# Patient Record
Sex: Female | Born: 1975 | Race: White | Hispanic: No | Marital: Married | Smoking: Never smoker
Health system: Southern US, Community
[De-identification: ages and names within clinical notes are randomized; demographics above are authoritative.]

## PROBLEM LIST (undated history)

## (undated) DIAGNOSIS — R002 Palpitations: Secondary | ICD-10-CM

## (undated) DIAGNOSIS — R102 Pelvic and perineal pain: Principal | ICD-10-CM

## (undated) DIAGNOSIS — D649 Anemia, unspecified: Secondary | ICD-10-CM

## (undated) DIAGNOSIS — G589 Mononeuropathy, unspecified: Secondary | ICD-10-CM

---
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
## 2011-10-07 HISTORY — PX: BREAST SURGERY: SHX581

## 2012-10-06 HISTORY — PX: ABLATION: SHX5711

## 2017-04-22 ENCOUNTER — Ambulatory Visit (INDEPENDENT_AMBULATORY_CARE_PROVIDER_SITE_OTHER): Payer: 59 | Admitting: Podiatry

## 2017-04-22 ENCOUNTER — Encounter: Payer: Self-pay | Admitting: Podiatry

## 2017-04-22 VITALS — BP 117/67 | HR 69 | Resp 18

## 2017-04-22 DIAGNOSIS — B070 Plantar wart: Secondary | ICD-10-CM | POA: Diagnosis not present

## 2017-04-22 NOTE — Patient Instructions (Signed)
Today your exam demonstrated a growth that is most likely a plantar wart. As you have a history of recent infection will defer curettage for removal until next week

## 2017-04-22 NOTE — Progress Notes (Signed)
   Subjective:    Patient ID: Shannon Acosta, female    DOB: 01-Aug-1976, 41 y.o.   MRN: [id number]  HPI This patient presents today stating that her previous podiatrist as referred her to our office for a second opinion. Patient describes a painful plantar skin lesion present since approximately March 2018. She visited podiatrist in [place] approximately June 22 and describes 1 treatment with cryotherapy. After the treatment patient describes an infection posttreatment and has completed 20 days of Augmentin with last dose one day prior. At this time the lesion still persists as uncomfortable walking wearing shoes. Patient ambulates and surgical shoe.  Patient is otherwise healthy with no known health concerns or active medications Patient denies smoking history Patient states she is recently relocated from [place] to [place]  All other systems reviewed and are negative.      Objective:   Physical Exam  BP of 117/67 Pulse 69 Respiration respiration 18   Vascular: No calf edema or calf tenderness bilaterally DP and PT pulses 2/4 bilaterally Capillary reflex immediate bilaterally  Neurological: Proprioception intact bilaterally  Dermatological: No open skin lesions bilaterally Texture and turgor within normal bilaterally Plantar hemorrhagic skin lesion approximately 5 mm plantar base fifth metatarsal area. Debridement of this lesion demonstrates pinpoint bleeding with some hyperkeratotic tissue. There is no surrounding erythema, edema, drainage, warmth  Musculoskeletal: No deformities noted There is no restriction ankle, subtalar, midtarsal joints bilaterally       Assessment & Plan:   Assessment: Plantar wart 1 left foot No clinical sign of infection about suspect warty lesion plantar aspect left foot  Plan: Today I reviewed the results of the exam with patient today. I informed her that the lesion was most consistent with a plantar  wart. I recommended curettage and biopsy of this lesion. I informed patient that there were multiple ways to treat suspect plantar warts. I made aware that curettage also has a possibility of recurrence and infection. At this time I recommend patient wait for approximately a week to allow the previously infected site to resolve, although there is no clinical sign of infection noted today Patient will continue ambulating surgical shoe Reappoint 7 days for curettage and biopsy of plantar skin lesion left

## 2017-05-06 ENCOUNTER — Ambulatory Visit: Payer: 59 | Admitting: Podiatry

## 2017-05-20 ENCOUNTER — Encounter: Payer: Self-pay | Admitting: Podiatry

## 2017-05-20 ENCOUNTER — Ambulatory Visit (INDEPENDENT_AMBULATORY_CARE_PROVIDER_SITE_OTHER): Payer: 59 | Admitting: Podiatry

## 2017-05-20 DIAGNOSIS — B070 Plantar wart: Secondary | ICD-10-CM

## 2017-05-20 NOTE — Patient Instructions (Signed)
ANTIBACTERIAL SOAP INSTRUCTIONS or soft soap  THE DAY AFTER PROCEDURE  Please follow the instructions your doctor has marked.   Shower as usual. Before getting out, place a drop of antibacterial liquid soap (Dial) on a wet, clean washcloth.  Gently wipe washcloth over affected area.  Afterward, rinse the area with warm water.  Blot the area dry with a soft cloth and cover with antibiotic ointment (neosporin, polysporin, bacitracin) and band aid or gauze and tape  Place 3-4 drops of antibacterial liquid soap in a quart of warm tap water.  Submerge foot into water for 2 minutes.  If bandage was applied after your procedure, leave on to allow for easy lift off, then remove and continue with soak for the remaining time.  Next, blot area dry with a soft cloth and cover with a bandage.  Apply other medications as directed by your doctor, such aso neosporin antibiotic ointment  Place a foam pad around the wound and apply topical antibiotic ointment essential portion of the wound cover with gauze and attach with paper tape  Okay to use ibuprofen for pain control

## 2017-05-20 NOTE — Progress Notes (Signed)
Patient ID: Shannon Acosta, female   DOB: 05-Aug-1976, 41 y.o.   MRN: [id number]   Subjective: This patient presents for a scheduled visit for curettage and biopsy of a painful plantar skin lesions suspect or the plantar aspect.  HPI This patient presents today stating that her previous podiatrist as referred her to our office for a second opinion. Patient describes a painful plantar skin lesion present since approximately March 2018. She visited podiatrist in [place] approximately June 22 and describes 1 treatment with cryotherapy. After the treatment patient describes an infection posttreatment and has completed 20 days of Augmentin with last dose one day prior. At this time the lesion still persists as uncomfortable walking wearing shoes. Patient ambulates and surgical shoe.  Patient is otherwise healthy with no known health concerns or active medications Patient denies smoking history Patient states she is recently relocated from [place] to [place]  All other systems reviewed and are negative.  Objective:  Vascular: No calf edema or calf tenderness bilaterally DP and PT pulses 2/4 bilaterally Capillary reflex immediate bilaterally  Neurological: Proprioception intact bilaterally  Dermatological: No open skin lesions bilaterally Texture and turgor within normal bilaterally Plantar hemorrhagic skin lesion approximately 10 mm plantar base fifth metatarsal left foot  Assessment: Suspect plantar wart left foot  Plan: Patient verbally consents to curettage and biopsy The area was infiltrated with 60 mg Xylocaine with epinephrine 1 100,000. There is pain with Betadine The soft tissue lesion was circumscribed and curetted and the wound base painted with phenol. An antibiotic compression dressing was applied. Patient tolerated procedure without any difficulty Lesion submitted or biopsy with report pending  Postoperative soaks and antibiotic dressings  prescribed Over-the-counter NSAID Rx for pain control Patient will contact she has any future concerns Will notify patient is any concerns other than warty lesions with pending biopsy

## 2017-05-21 ENCOUNTER — Other Ambulatory Visit: Payer: Self-pay | Admitting: Podiatry

## 2017-05-21 NOTE — Addendum Note (Signed)
Addended by: Roney Jaffe on: 05/21/2017 01:35 PM   Modules accepted: Orders

## 2017-05-21 NOTE — Addendum Note (Signed)
Addended by: Cranford Mon R on: 05/21/2017 01:28 PM   Modules accepted: Orders

## 2017-05-25 LAB — PATHOLOGY

## 2017-05-26 ENCOUNTER — Ambulatory Visit: Payer: 59 | Admitting: Podiatry

## 2017-05-27 ENCOUNTER — Telehealth: Payer: Self-pay | Admitting: *Deleted

## 2017-05-27 NOTE — Telephone Encounter (Signed)
Shannon Acosta states she is not able to access pathology and transferred to their phone line. Left message for pathology to contact me [phone] or fax results [phone] . Shannon Acosta states she is unable to fax to [phone]. I told Shannon Acosta to fax to [phone]. Results of pathology received and given to Dr. Amalia Hailey.

## 2017-10-23 DIAGNOSIS — R109 Unspecified abdominal pain: Secondary | ICD-10-CM | POA: Diagnosis not present

## 2017-10-23 DIAGNOSIS — Z130 Encounter for screening for diseases of the blood and blood-forming organs and certain disorders involving the immune mechanism: Secondary | ICD-10-CM | POA: Diagnosis not present

## 2017-10-23 DIAGNOSIS — Z1329 Encounter for screening for other suspected endocrine disorder: Secondary | ICD-10-CM | POA: Diagnosis not present

## 2017-10-23 DIAGNOSIS — Z1231 Encounter for screening mammogram for malignant neoplasm of breast: Secondary | ICD-10-CM | POA: Diagnosis not present

## 2017-10-23 DIAGNOSIS — Z131 Encounter for screening for diabetes mellitus: Secondary | ICD-10-CM | POA: Diagnosis not present

## 2017-10-23 DIAGNOSIS — Z13220 Encounter for screening for lipoid disorders: Secondary | ICD-10-CM | POA: Diagnosis not present

## 2017-10-23 DIAGNOSIS — Z01419 Encounter for gynecological examination (general) (routine) without abnormal findings: Secondary | ICD-10-CM | POA: Diagnosis not present

## 2017-10-23 DIAGNOSIS — Z6824 Body mass index (BMI) 24.0-24.9, adult: Secondary | ICD-10-CM | POA: Diagnosis not present

## 2017-10-23 DIAGNOSIS — Z0000 Encounter for general adult medical examination without abnormal findings: Secondary | ICD-10-CM | POA: Diagnosis not present

## 2017-11-04 DIAGNOSIS — M531 Cervicobrachial syndrome: Secondary | ICD-10-CM | POA: Diagnosis not present

## 2017-11-04 DIAGNOSIS — M5032 Other cervical disc degeneration, mid-cervical region, unspecified level: Secondary | ICD-10-CM | POA: Diagnosis not present

## 2017-11-04 DIAGNOSIS — M9901 Segmental and somatic dysfunction of cervical region: Secondary | ICD-10-CM | POA: Diagnosis not present

## 2017-11-05 DIAGNOSIS — R102 Pelvic and perineal pain: Secondary | ICD-10-CM | POA: Diagnosis not present

## 2017-11-05 DIAGNOSIS — Z1231 Encounter for screening mammogram for malignant neoplasm of breast: Secondary | ICD-10-CM | POA: Diagnosis not present

## 2017-11-05 DIAGNOSIS — Z01419 Encounter for gynecological examination (general) (routine) without abnormal findings: Secondary | ICD-10-CM | POA: Diagnosis not present

## 2017-11-06 DIAGNOSIS — G589 Mononeuropathy, unspecified: Secondary | ICD-10-CM

## 2017-11-06 HISTORY — DX: Mononeuropathy, unspecified: G58.9

## 2017-11-10 DIAGNOSIS — R102 Pelvic and perineal pain: Secondary | ICD-10-CM | POA: Diagnosis not present

## 2017-11-13 ENCOUNTER — Other Ambulatory Visit: Payer: Self-pay | Admitting: Obstetrics and Gynecology

## 2017-11-13 DIAGNOSIS — N858 Other specified noninflammatory disorders of uterus: Secondary | ICD-10-CM

## 2017-11-15 ENCOUNTER — Ambulatory Visit
Admission: RE | Admit: 2017-11-15 | Discharge: 2017-11-15 | Disposition: A | Discharge status: Home / Self Care | Payer: 59 | Source: Ambulatory Visit | Attending: Obstetrics and Gynecology | Admitting: Obstetrics and Gynecology

## 2017-11-15 DIAGNOSIS — N858 Other specified noninflammatory disorders of uterus: Secondary | ICD-10-CM

## 2017-11-15 DIAGNOSIS — D251 Intramural leiomyoma of uterus: Secondary | ICD-10-CM | POA: Diagnosis not present

## 2017-12-03 NOTE — Patient Instructions (Addendum)
Your procedure is scheduled on: Tuesday, March 19  Enter through the Main Entrance of [facility] at: 6 am  Pick up the phone at the desk and dial [phone].  Call this number if you have problems the morning of surgery: [phone].  Remember: Do NOT eat or Do NOT drink clear liquids (including water) after midnight Monday.  Take these medicines the morning of surgery with a SIP OF WATER: None  Bring inhaler with you on day of surgery.  Do Not smoke on the day of surgery.  Stop herbal medications and supplements at this time.  Do NOT wear jewelry (body piercing), metal hair clips/bobby pins, make-up, or nail polish. Do NOT wear lotions, powders, or perfumes.  You may wear deoderant. Do NOT shave for 48 hours prior to surgery. Do NOT bring valuables to the hospital. Contacts, dentures, or bridgework may not be worn into surgery.  Leave suitcase in car.  After surgery it may be brought to your room.  For patients admitted to the hospital, checkout time is 11:00 AM the day of discharge. Have a responsible adult drive you home and stay with you for 24 hours after your procedure

## 2017-12-11 ENCOUNTER — Inpatient Hospital Stay (HOSPITAL_COMMUNITY)
Admission: RE | Admit: 2017-12-11 | Discharge: 2017-12-11 | Disposition: A | Discharge status: Home / Self Care | Payer: 59 | Source: Ambulatory Visit

## 2017-12-11 ENCOUNTER — Other Ambulatory Visit (HOSPITAL_COMMUNITY): Payer: 59

## 2017-12-14 ENCOUNTER — Encounter (HOSPITAL_COMMUNITY)
Admission: RE | Admit: 2017-12-14 | Discharge: 2017-12-14 | Disposition: A | Discharge status: Home / Self Care | Payer: 59 | Source: Ambulatory Visit | Attending: Obstetrics and Gynecology | Admitting: Obstetrics and Gynecology

## 2017-12-14 ENCOUNTER — Encounter (HOSPITAL_COMMUNITY): Payer: Self-pay

## 2017-12-14 ENCOUNTER — Other Ambulatory Visit: Payer: Self-pay

## 2017-12-14 DIAGNOSIS — Z01812 Encounter for preprocedural laboratory examination: Secondary | ICD-10-CM | POA: Insufficient documentation

## 2017-12-14 HISTORY — DX: Anemia, unspecified: D64.9

## 2017-12-14 HISTORY — DX: Mononeuropathy, unspecified: G58.9

## 2017-12-14 HISTORY — DX: Palpitations: R00.2

## 2017-12-14 LAB — CBC
HCT: 38.2 % (ref 36.0–46.0)
Hemoglobin: 13.2 g/dL (ref 12.0–15.0)
MCH: 29.9 pg (ref 26.0–34.0)
MCHC: 34.6 g/dL (ref 30.0–36.0)
MCV: 86.6 fL (ref 78.0–100.0)
PLATELETS: 206 10*3/uL (ref 150–400)
RBC: 4.41 MIL/uL (ref 3.87–5.11)
RDW: 11.9 % (ref 11.5–15.5)
WBC: 6.2 10*3/uL (ref 4.0–10.5)

## 2017-12-14 LAB — TYPE AND SCREEN
ABO/RH(D): A POS
ANTIBODY SCREEN: NEGATIVE

## 2017-12-14 LAB — COMPREHENSIVE METABOLIC PANEL
ALK PHOS: 35 U/L — AB (ref 38–126)
ALT: 10 U/L — AB (ref 14–54)
AST: 17 U/L (ref 15–41)
Albumin: 4.2 g/dL (ref 3.5–5.0)
Anion gap: 7 (ref 5–15)
BILIRUBIN TOTAL: 0.8 mg/dL (ref 0.3–1.2)
BUN: 13 mg/dL (ref 6–20)
CHLORIDE: 105 mmol/L (ref 101–111)
CO2: 24 mmol/L (ref 22–32)
Calcium: 9.2 mg/dL (ref 8.9–10.3)
Creatinine, Ser: 0.66 mg/dL (ref 0.44–1.00)
Glucose, Bld: 86 mg/dL (ref 65–99)
Potassium: 3.9 mmol/L (ref 3.5–5.1)
Sodium: 136 mmol/L (ref 135–145)
Total Protein: 6.7 g/dL (ref 6.5–8.1)

## 2017-12-14 LAB — ABO/RH: ABO/RH(D): A POS

## 2017-12-14 NOTE — Patient Instructions (Addendum)
Your procedure is scheduled on: Tuesday December 22, 2017 at 7:30 am  Enter through the Main Entrance of [facility] at: 6:00 am  Pick up the phone at the desk and dial [phone].  Call this number if you have problems the morning of surgery: [phone].  Remember: Do NOT eat food or drink any liquids after: Midnight on Monday March 18  Take these medicines the morning of surgery with a SIP OF WATER: NONE  Stop any herbal products or supplements 1 week prior to surgery   Do not smoke day of surgery  Do NOT wear jewelry (body piercing), metal hair clips/bobby pins, make-up, or nail polish. Do NOT wear lotions, powders, or perfumes.  You may wear deoderant. Do NOT shave for 48 hours prior to surgery. Do NOT bring valuables to the hospital. Contacts, dentures, or bridgework may not be worn into surgery. Leave suitcase in car.  After surgery it may be brought to your room. For patients admitted to the hospital, checkout time is 11:00 AM the day of discharge.

## 2017-12-18 DIAGNOSIS — N83202 Unspecified ovarian cyst, left side: Secondary | ICD-10-CM | POA: Diagnosis not present

## 2017-12-18 DIAGNOSIS — D251 Intramural leiomyoma of uterus: Secondary | ICD-10-CM | POA: Diagnosis not present

## 2017-12-19 DIAGNOSIS — J029 Acute pharyngitis, unspecified: Secondary | ICD-10-CM | POA: Diagnosis not present

## 2017-12-19 DIAGNOSIS — H66003 Acute suppurative otitis media without spontaneous rupture of ear drum, bilateral: Secondary | ICD-10-CM | POA: Diagnosis not present

## 2017-12-21 ENCOUNTER — Encounter (HOSPITAL_COMMUNITY): Payer: Self-pay | Admitting: Obstetrics and Gynecology

## 2017-12-21 DIAGNOSIS — R102 Pelvic and perineal pain: Secondary | ICD-10-CM

## 2017-12-21 HISTORY — DX: Pelvic and perineal pain: R10.2

## 2017-12-21 NOTE — H&P (Signed)
Shannon Acosta is an 42 y.o. female [id number] with pelvic pain - had complex L ovarian cyst, resolved on recent US.  Also at time of [place], ? Uterine mass, s/p endometrial ablation - had MRI - categorized as degenerating fibroid.  D/W pt US findings and r/b/a of surgery, will proceed.  Seen at urgent care over the weekend - negative flu and strep.  Being treated with Augmentin for ear infection.    Pertinent Gynecological History: OB History: G3, P3003; 7#11-9#6, F, M,M  +abn pap, colpo, nl since Last 3/17 WNL No STDs   Menstrual History:  No LMP recorded.    Past Medical History:  Diagnosis Date  . Anemia   . Palpitations    OCCASIONAL  . Pelvic pain 12/21/2017  . Pinched nerve 11/2017   NECK, WEMT TO CRAPRACTOR   PSH: endometrial ablation, breast implants, tonsillectomy  FH: HTN, CVA, pulmonary fibrosis  Social History:  reports that  has never smoked. she has never used smokeless tobacco. She reports that she does not drink alcohol or use drugs.  Married, SAHM  Meds none  Allergies:  Allergies  Allergen Reactions  . Codeine Shortness Of Breath and Other (See Comments)    Patient states she had severe chest pain        Review of Systems  Constitutional: Negative.   HENT: Negative.   Eyes: Negative.   Respiratory: Negative.   Cardiovascular: Negative.   Gastrointestinal: Negative.   Genitourinary: Negative.   Musculoskeletal: Negative.   Skin: Negative.   Neurological: Negative.   Psychiatric/Behavioral: Negative.     There were no vitals taken for this visit. Physical Exam  Constitutional: She is oriented to person, place, and time. She appears well-developed and well-nourished.  HENT:  Head: Normocephalic and atraumatic.  Diagnosed w [facility] over-the-weekend  Cardiovascular: Normal rate and regular rhythm.  Respiratory: Effort normal and breath sounds normal. No respiratory distress. She has no wheezes.  GI: Soft. Bowel sounds are normal. She exhibits no  distension. There is no tenderness.  Musculoskeletal: Normal range of motion.  Neurological: She is alert and oriented to person, place, and time.  Skin: Skin is warm and dry.  Psychiatric: She has a normal mood and affect. Her behavior is normal.    Ur Cx neg [place] nl sized uterus, ?mass in uterus, complex mass on L MRI degen fibroid F/u US, nl ovaries (cyst resolved) heterogeneous uterine lining - c/w endo ablation  Assessment/Plan: 42yo G3P3 w pelvic pain for LAVH/BS Ancef for prophylaxis D/w pt r/b/a of surgery in light of normalized [place] Will likely proceed  Shannon Acosta 12/21/2017, 8:05 AM

## 2017-12-22 ENCOUNTER — Ambulatory Visit (HOSPITAL_COMMUNITY): Admission: AD | Admit: 2017-12-22 | Payer: 59 | Source: Ambulatory Visit | Admitting: Obstetrics and Gynecology

## 2017-12-22 ENCOUNTER — Encounter (HOSPITAL_COMMUNITY): Admission: AD | Payer: Self-pay | Source: Ambulatory Visit

## 2017-12-22 HISTORY — DX: Pelvic and perineal pain: R10.2

## 2017-12-22 SURGERY — HYSTERECTOMY, VAGINAL, LAPAROSCOPY-ASSISTED, WITH SALPINGECTOMY
Anesthesia: Choice | Laterality: Left

## 2018-01-08 DIAGNOSIS — M722 Plantar fascial fibromatosis: Secondary | ICD-10-CM | POA: Diagnosis not present

## 2018-01-08 DIAGNOSIS — M7732 Calcaneal spur, left foot: Secondary | ICD-10-CM | POA: Diagnosis not present

## 2018-01-08 DIAGNOSIS — M71572 Other bursitis, not elsewhere classified, left ankle and foot: Secondary | ICD-10-CM | POA: Diagnosis not present

## 2018-04-14 DIAGNOSIS — R002 Palpitations: Secondary | ICD-10-CM | POA: Diagnosis not present

## 2018-04-14 DIAGNOSIS — Z0000 Encounter for general adult medical examination without abnormal findings: Secondary | ICD-10-CM | POA: Diagnosis not present

## 2018-04-14 DIAGNOSIS — R200 Anesthesia of skin: Secondary | ICD-10-CM | POA: Diagnosis not present

## 2018-04-14 DIAGNOSIS — R251 Tremor, unspecified: Secondary | ICD-10-CM | POA: Diagnosis not present

## 2018-04-14 DIAGNOSIS — R6889 Other general symptoms and signs: Secondary | ICD-10-CM | POA: Diagnosis not present

## 2018-04-22 ENCOUNTER — Ambulatory Visit (HOSPITAL_COMMUNITY)
Admission: EM | Admit: 2018-04-22 | Discharge: 2018-04-22 | Disposition: A | Discharge status: Home / Self Care | Payer: 59 | Attending: Internal Medicine | Admitting: Internal Medicine

## 2018-04-22 ENCOUNTER — Ambulatory Visit (INDEPENDENT_AMBULATORY_CARE_PROVIDER_SITE_OTHER): Payer: 59

## 2018-04-22 ENCOUNTER — Encounter (HOSPITAL_COMMUNITY): Payer: Self-pay | Admitting: Emergency Medicine

## 2018-04-22 ENCOUNTER — Other Ambulatory Visit: Payer: Self-pay

## 2018-04-22 DIAGNOSIS — M545 Low back pain: Secondary | ICD-10-CM | POA: Diagnosis not present

## 2018-04-22 DIAGNOSIS — M5440 Lumbago with sciatica, unspecified side: Secondary | ICD-10-CM | POA: Diagnosis not present

## 2018-04-22 DIAGNOSIS — K5901 Slow transit constipation: Secondary | ICD-10-CM

## 2018-04-22 MED ORDER — CYCLOBENZAPRINE HCL 5 MG PO TABS: 5.0000 mg | ORAL_TABLET | Freq: Every evening | ORAL | 0 refills | Status: AC | PRN

## 2018-04-22 MED ORDER — MELOXICAM 7.5 MG PO TABS: 7.5000 mg | ORAL_TABLET | Freq: Every day | ORAL | 1 refills | Status: AC

## 2018-04-22 NOTE — ED Triage Notes (Signed)
Dr Marcille Blanco with patient

## 2018-04-22 NOTE — ED Notes (Signed)
Bed: UC01 Expected date:  Expected time:  Means of arrival:  Comments: appts

## 2018-04-22 NOTE — ED Provider Notes (Signed)
[facility]    CSN: [id number] Arrival date & time: 04/22/18  1449     History   Chief Complaint Chief Complaint  Patient presents with  . Appointment    3:00PM  . Pain    HPI Shannon Acosta is a 42 y.o. female.   42 year old female without chronic medical problems presents clinic complaining of lower back pain.  She bent over this morning to pick up a brush when she felt pain in her tailbone.  Patient also admits that she has pain going down her legs.  This happens with or without bending over sometimes.  She also has problems dorsiflexing her back after bending over at times.  She denies numbness or tingling of her lower extremities.  She also denies weakness in her legs.  The patient also complains of right hand tingling at times as well as pain in her right trapezius muscle.  The patient has been going to the chiropractor for a few months as well as receiving therapeutic massages regularly.  She does not get significant relief from either of these 2 treatments.  She also reports that she has had some fatigue which prompted her primary care physician to check ANA levels which were abnormally high.  She has a pending referral to rheumatology.     Past Medical History:  Diagnosis Date  . Anemia   . Palpitations    OCCASIONAL  . Pelvic pain 12/21/2017  . Pinched nerve 11/2017   NECK, WEMT TO CRAPRACTOR    Patient Active Problem List   Diagnosis Date Noted  . Pelvic pain 12/21/2017    Past Surgical History:  Procedure Laterality Date  . ABLATION  2014  . BREAST SURGERY  2013   AUGMENTATION  . TONSILLECTOMY  1994    OB History   None      Home Medications    Prior to Admission medications   Medication Sig Start Date End Date Taking? Authorizing Provider  cyclobenzaprine (FLEXERIL) 5 MG tablet Take 1 tablet (5 mg total) by mouth at bedtime as needed for muscle spasms. 04/22/18   Harrie Foreman, MD  meloxicam (MOBIC) 7.5 MG tablet Take 1 tablet  (7.5 mg total) by mouth daily. 04/22/18   Harrie Foreman, MD  Tetrahydroz-Glyc-Hyprom-PEG (VISINE MAXIMUM REDNESS RELIEF) 0.05-0.2-0.36-1 % SOLN Place 1-2 drops into both eyes 3 (three) times daily as needed (for dry/irritated eyes.).    [provider]    Family History Family History  Problem Relation Age of Onset  . Lung disease Father     Social History Social History   Tobacco Use  . Smoking status: Never Smoker  . Smokeless tobacco: Never Used  Substance Use Topics  . Alcohol use: No    Frequency: Never  . Drug use: No     Allergies   Codeine   Review of Systems Review of Systems  Constitutional: Negative for chills and fever.  HENT: Negative for sore throat and tinnitus.   Eyes: Negative for redness.  Respiratory: Negative for cough and shortness of breath.   Cardiovascular: Negative for chest pain and palpitations.  Gastrointestinal: Positive for constipation. Negative for abdominal pain, diarrhea, nausea and vomiting.  Genitourinary: Negative for dysuria, frequency and urgency.  Musculoskeletal: Positive for back pain and neck stiffness. Negative for myalgias.  Skin: Negative for rash.       No lesions  Neurological: Negative for weakness.  Hematological: Does not bruise/bleed easily.  Psychiatric/Behavioral: Negative for suicidal ideas.  Physical Exam Triage Vital Signs ED Triage Vitals  Enc Vitals Group     BP 04/22/18 1459 120/68     Pulse Rate 04/22/18 1459 81     Resp 04/22/18 1459 16     Temp 04/22/18 1459 97.8 F (36.6 C)     Temp Source 04/22/18 1459 Oral     SpO2 04/22/18 1459 100 %     Weight --      Height --      Head Circumference --      Peak Flow --      Pain Score 04/22/18 1642 0     Pain Loc --      Pain Edu? --      Excl. in [place]? --    No data found.  Updated Vital Signs BP 120/68 (BP Location: Right Arm)   Pulse 81   Temp 97.8 F (36.6 C) (Oral)   Resp 16   SpO2 100%   Visual Acuity Right Eye  Distance:   Left Eye Distance:   Bilateral Distance:    Right Eye Near:   Left Eye Near:    Bilateral Near:     Physical Exam  Constitutional: She is oriented to person, place, and time. She appears well-developed and well-nourished. No distress.  HENT:  Head: Normocephalic and atraumatic.  Mouth/Throat: Oropharynx is clear and moist.  Eyes: Pupils are equal, round, and reactive to light. Conjunctivae and EOM are normal. No scleral icterus.  Neck: Normal range of motion. Neck supple. No JVD present. No tracheal deviation present. No thyromegaly present.  Cardiovascular: Normal rate, regular rhythm and normal heart sounds. Exam reveals no gallop and no friction rub.  No murmur heard. Pulmonary/Chest: Effort normal and breath sounds normal.  Abdominal: Soft. Bowel sounds are normal. She exhibits no distension. There is no tenderness.  Musculoskeletal: Normal range of motion. She exhibits no edema.  Lymphadenopathy:    She has no cervical adenopathy.  Neurological: She is alert and oriented to person, place, and time. No cranial nerve deficit.  Skin: Skin is warm and dry.  Psychiatric: She has a normal mood and affect. Her behavior is normal. Judgment and thought content normal.  Nursing note and vitals reviewed.    UC Treatments / Results  Labs (all labs ordered are listed, but only abnormal results are displayed) Labs Reviewed - No data to display  EKG None  Radiology Dg Lumbar Spine Complete  Result Date: 04/22/2018 CLINICAL DATA:  Low back pain while walking EXAM: LUMBAR SPINE - COMPLETE 4+ VIEW COMPARISON:  None. FINDINGS: There is no evidence of lumbar spine fracture. Alignment is normal. Intervertebral disc spaces are maintained. IMPRESSION: Negative. Electronically Signed   By: Ulyses Jarred M.D.   On: 04/22/2018 16:04    Procedures Procedures (including critical care time)  Medications Ordered in UC Medications - No data to display  Initial Impression /  Assessment and Plan / UC Course  I have reviewed the triage vital signs and the nursing notes.  Pertinent labs & imaging results that were available during my care of the patient were reviewed by me and considered in my medical decision making (see chart for details).     Lumbar films do not show arthritis or sacroiliitis.  X-ray does demonstrate significant constipation.  The patient states that she has painful bowel movements.  We discussed that over that time chronic constipation can lead to vague mild to moderate back pain.  She also has mild scoliosis of her  lumbar spine but this to would not lead to acute pain or intermittent sciatica the patient describes.  She has no red flag symptoms.  No loss of bowel or bladder continence.  Denies fevers.  No neurologic deficits on exam today.  Recommended staying active as well as anti-inflammatory therapy.  Muscle relaxer prescribed for nighttime.  Also recommend daily stool softener as well as laxative until she is consistently regular.  Reassured that MiraLAX is electrolyte neutral.  Unlikely that back pain is secondary to rheumatologic illness based on exam and imaging today.  Final Clinical Impressions(s) / UC Diagnoses   Final diagnoses:  Bilateral low back pain with sciatica, sciatica laterality unspecified, unspecified chronicity  Slow transit constipation   Discharge Instructions   None    ED Prescriptions    Medication Sig Dispense Auth. Provider   meloxicam (MOBIC) 7.5 MG tablet Take 1 tablet (7.5 mg total) by mouth daily. 30 tablet Harrie Foreman, MD   cyclobenzaprine (FLEXERIL) 5 MG tablet Take 1 tablet (5 mg total) by mouth at bedtime as needed for muscle spasms. 30 tablet Harrie Foreman, MD     Controlled Substance Prescriptions [place] Controlled Substance Registry consulted? Not Applicable   Harrie Foreman, MD 04/22/18 1714

## 2018-06-04 DIAGNOSIS — C44729 Squamous cell carcinoma of skin of left lower limb, including hip: Secondary | ICD-10-CM | POA: Diagnosis not present

## 2018-06-04 DIAGNOSIS — D229 Melanocytic nevi, unspecified: Secondary | ICD-10-CM | POA: Diagnosis not present

## 2018-06-04 DIAGNOSIS — D485 Neoplasm of uncertain behavior of skin: Secondary | ICD-10-CM | POA: Diagnosis not present

## 2018-06-04 DIAGNOSIS — D1801 Hemangioma of skin and subcutaneous tissue: Secondary | ICD-10-CM | POA: Diagnosis not present

## 2018-06-04 DIAGNOSIS — L905 Scar conditions and fibrosis of skin: Secondary | ICD-10-CM | POA: Diagnosis not present

## 2018-06-17 DIAGNOSIS — M25551 Pain in right hip: Secondary | ICD-10-CM | POA: Diagnosis not present

## 2018-06-18 DIAGNOSIS — D0472 Carcinoma in situ of skin of left lower limb, including hip: Secondary | ICD-10-CM | POA: Diagnosis not present

## 2018-08-13 DIAGNOSIS — R0602 Shortness of breath: Secondary | ICD-10-CM | POA: Diagnosis not present

## 2018-08-13 DIAGNOSIS — J209 Acute bronchitis, unspecified: Secondary | ICD-10-CM | POA: Diagnosis not present

## 2018-08-20 DIAGNOSIS — B9789 Other viral agents as the cause of diseases classified elsewhere: Secondary | ICD-10-CM | POA: Diagnosis not present

## 2018-08-20 DIAGNOSIS — J069 Acute upper respiratory infection, unspecified: Secondary | ICD-10-CM | POA: Diagnosis not present

## 2018-09-17 DIAGNOSIS — B029 Zoster without complications: Secondary | ICD-10-CM | POA: Diagnosis not present

## 2019-10-07 IMAGING — DX DG LUMBAR SPINE COMPLETE 4+V
5 series · 5 of 5 positions shown · non-contrast
Comparison: None.

CLINICAL DATA: Low back pain while walking

EXAM:
LUMBAR SPINE - COMPLETE 4+ VIEW

[l-spine ap]
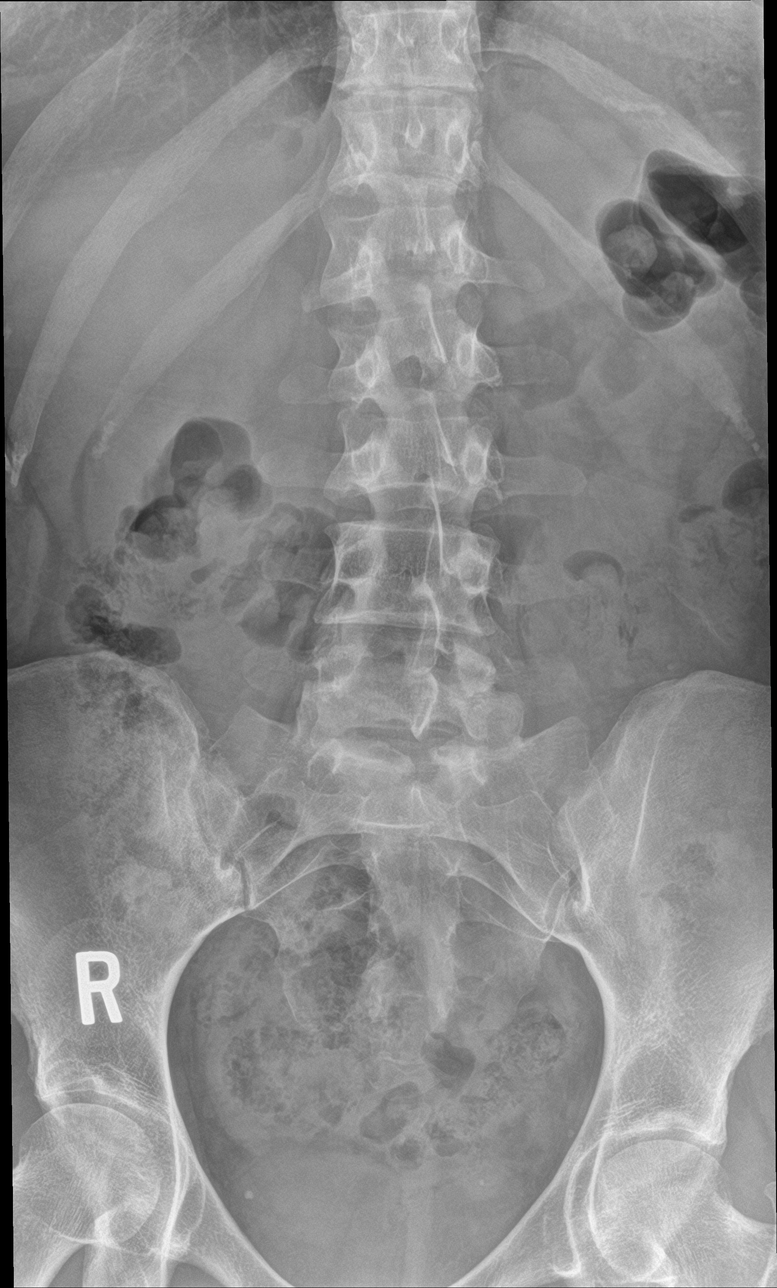

[l-spine obl (1 of 2)]
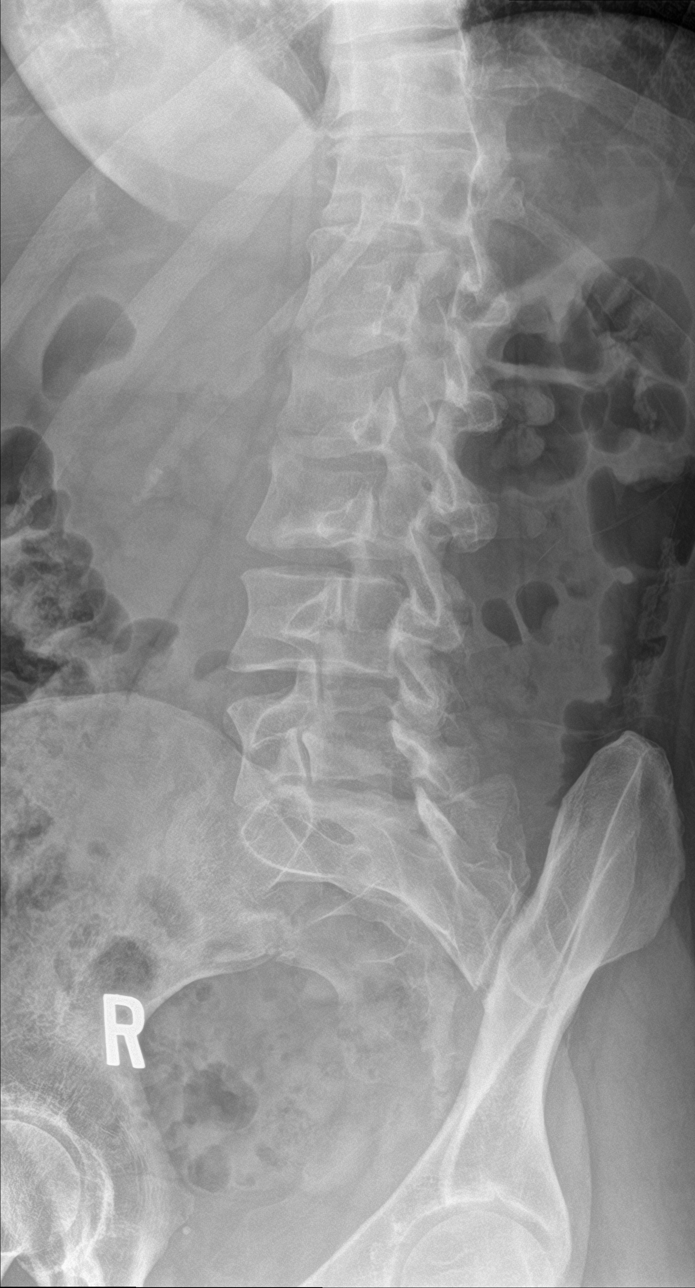

[l-spine obl (2 of 2)]
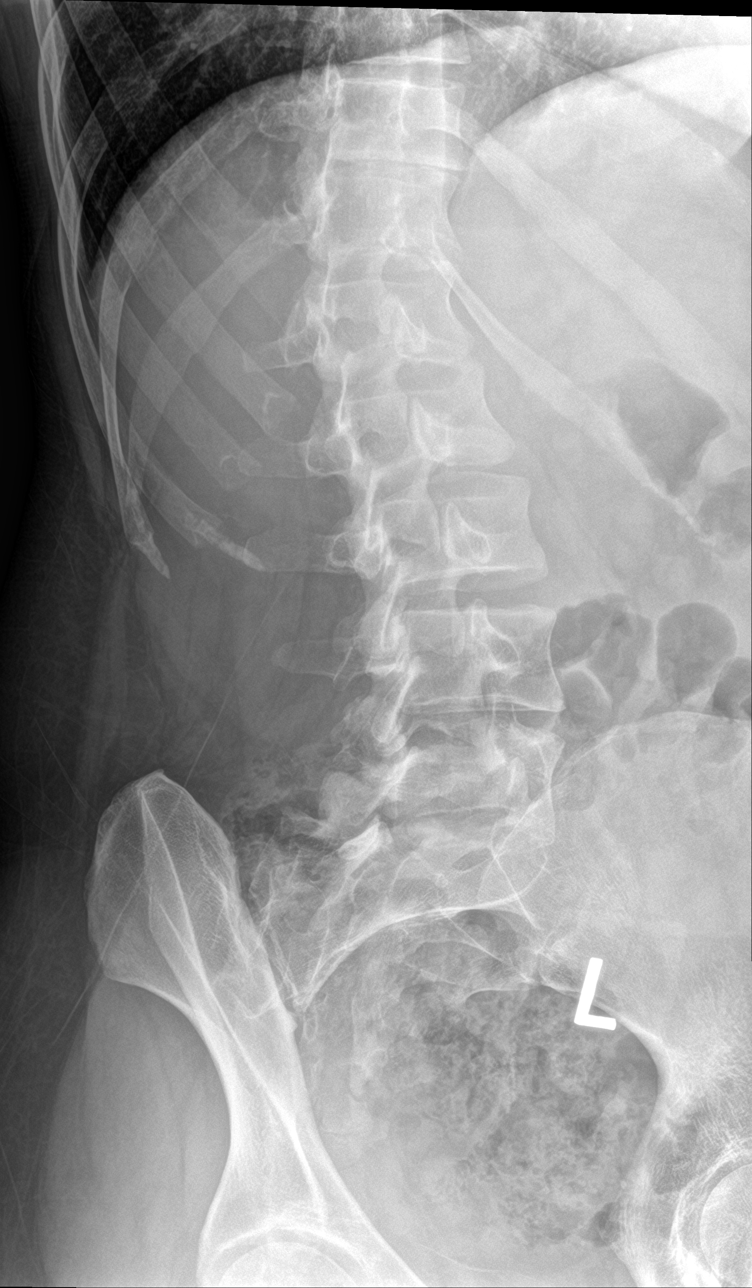

[l-spine lat]
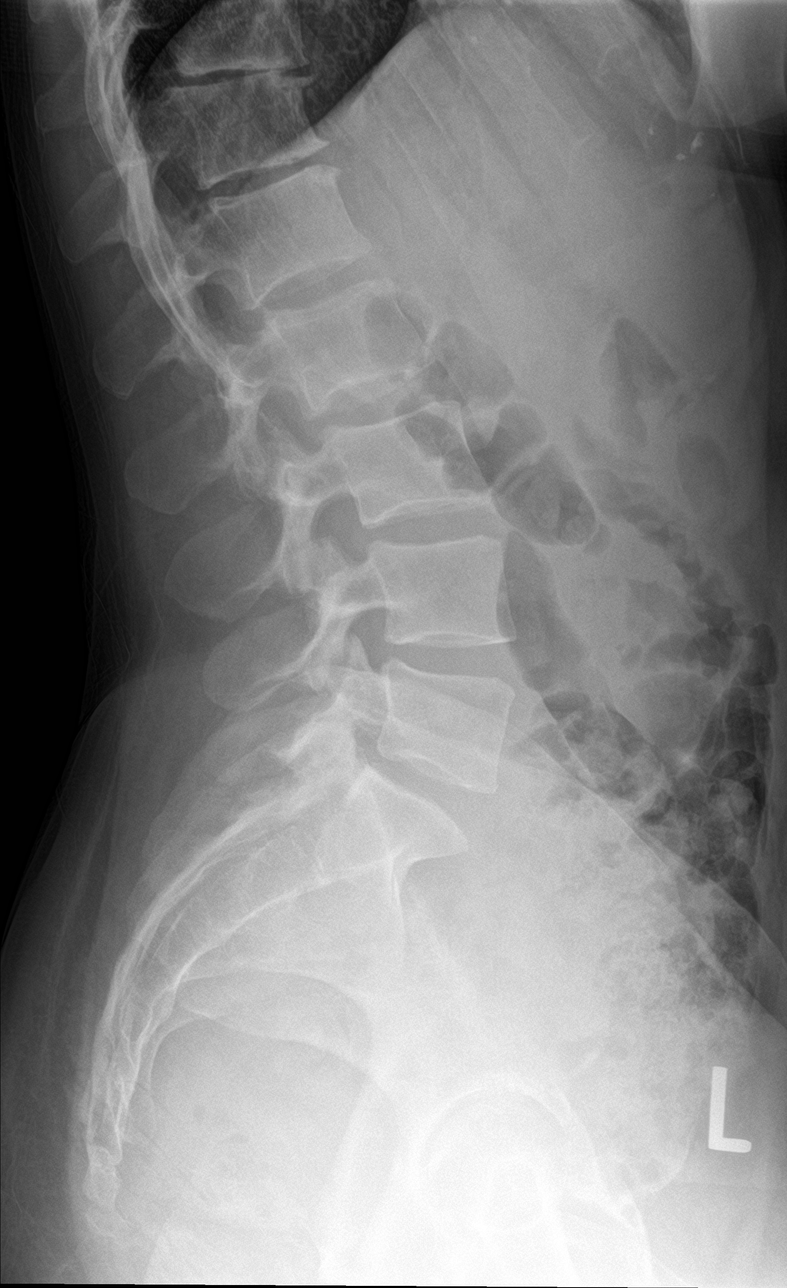

[l-spine spot]
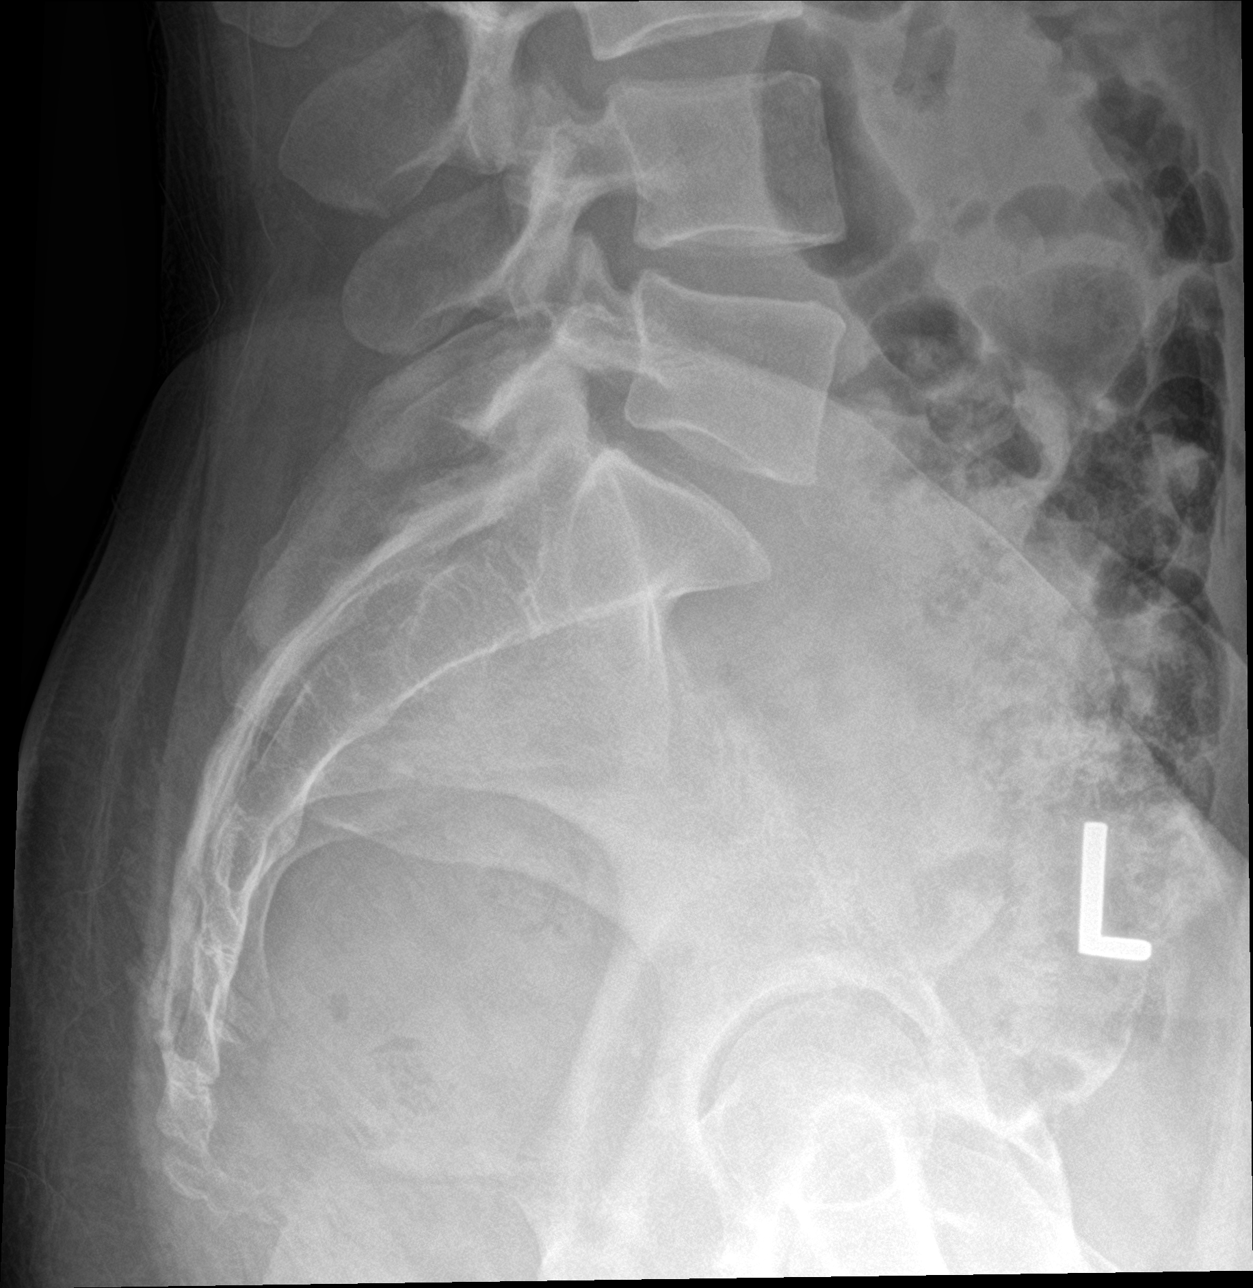

[5 of 5 positions shown; findings below may reference images not displayed]

FINDINGS: There is no evidence of lumbar spine fracture. Alignment is normal.
Intervertebral disc spaces are maintained.
IMPRESSION: Negative.
